# Patient Record
Sex: Female | Born: 2000
Health system: Southern US, Community
[De-identification: ages and names within clinical notes are randomized; demographics above are authoritative.]

## PROBLEM LIST (undated history)

## (undated) DIAGNOSIS — A749 Chlamydial infection, unspecified: Secondary | ICD-10-CM

## (undated) HISTORY — PX: TYMPANOSTOMY TUBE PLACEMENT: SHX32

## (undated) HISTORY — PX: TONSILLECTOMY: SUR1361

## (undated) HISTORY — DX: Chlamydial infection, unspecified: A74.9

---
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
## 2019-01-19 DIAGNOSIS — K5900 Constipation, unspecified: Secondary | ICD-10-CM | POA: Diagnosis not present

## 2019-02-10 DIAGNOSIS — Z01419 Encounter for gynecological examination (general) (routine) without abnormal findings: Secondary | ICD-10-CM | POA: Diagnosis not present

## 2019-02-10 DIAGNOSIS — A6004 Herpesviral vulvovaginitis: Secondary | ICD-10-CM | POA: Diagnosis not present

## 2019-02-10 DIAGNOSIS — Z113 Encounter for screening for infections with a predominantly sexual mode of transmission: Secondary | ICD-10-CM | POA: Diagnosis not present

## 2019-02-10 DIAGNOSIS — Z01411 Encounter for gynecological examination (general) (routine) with abnormal findings: Secondary | ICD-10-CM | POA: Diagnosis not present

## 2019-02-10 DIAGNOSIS — Z68.41 Body mass index (BMI) pediatric, 5th percentile to less than 85th percentile for age: Secondary | ICD-10-CM | POA: Diagnosis not present

## 2019-02-10 DIAGNOSIS — Z0001 Encounter for general adult medical examination with abnormal findings: Secondary | ICD-10-CM | POA: Diagnosis not present

## 2019-03-03 DIAGNOSIS — F1290 Cannabis use, unspecified, uncomplicated: Secondary | ICD-10-CM | POA: Diagnosis not present

## 2019-03-03 DIAGNOSIS — M542 Cervicalgia: Secondary | ICD-10-CM | POA: Diagnosis not present

## 2019-03-03 DIAGNOSIS — F17200 Nicotine dependence, unspecified, uncomplicated: Secondary | ICD-10-CM | POA: Diagnosis not present

## 2019-03-03 DIAGNOSIS — R55 Syncope and collapse: Secondary | ICD-10-CM | POA: Diagnosis not present

## 2019-03-03 DIAGNOSIS — S0990XA Unspecified injury of head, initial encounter: Secondary | ICD-10-CM | POA: Diagnosis not present

## 2019-03-03 DIAGNOSIS — M7918 Myalgia, other site: Secondary | ICD-10-CM | POA: Diagnosis not present

## 2019-03-03 DIAGNOSIS — J45909 Unspecified asthma, uncomplicated: Secondary | ICD-10-CM | POA: Diagnosis not present

## 2019-03-03 DIAGNOSIS — Z791 Long term (current) use of non-steroidal anti-inflammatories (NSAID): Secondary | ICD-10-CM | POA: Diagnosis not present

## 2019-03-03 DIAGNOSIS — Z79899 Other long term (current) drug therapy: Secondary | ICD-10-CM | POA: Diagnosis not present

## 2019-03-16 DIAGNOSIS — N760 Acute vaginitis: Secondary | ICD-10-CM | POA: Diagnosis not present

## 2019-03-29 DIAGNOSIS — N760 Acute vaginitis: Secondary | ICD-10-CM | POA: Diagnosis not present

## 2019-04-04 DIAGNOSIS — R1110 Vomiting, unspecified: Secondary | ICD-10-CM | POA: Diagnosis not present

## 2019-04-04 DIAGNOSIS — T673XXA Heat exhaustion, anhydrotic, initial encounter: Secondary | ICD-10-CM | POA: Diagnosis not present

## 2019-04-04 DIAGNOSIS — Z79899 Other long term (current) drug therapy: Secondary | ICD-10-CM | POA: Diagnosis not present

## 2019-04-04 DIAGNOSIS — F17210 Nicotine dependence, cigarettes, uncomplicated: Secondary | ICD-10-CM | POA: Diagnosis not present

## 2019-04-04 DIAGNOSIS — W92XXXA Exposure to excessive heat of man-made origin, initial encounter: Secondary | ICD-10-CM | POA: Diagnosis not present

## 2019-04-04 DIAGNOSIS — R112 Nausea with vomiting, unspecified: Secondary | ICD-10-CM | POA: Diagnosis not present

## 2019-04-04 DIAGNOSIS — J45909 Unspecified asthma, uncomplicated: Secondary | ICD-10-CM | POA: Diagnosis not present

## 2019-05-06 DIAGNOSIS — Z0000 Encounter for general adult medical examination without abnormal findings: Secondary | ICD-10-CM | POA: Diagnosis not present

## 2019-05-06 DIAGNOSIS — Z0001 Encounter for general adult medical examination with abnormal findings: Secondary | ICD-10-CM | POA: Diagnosis not present

## 2019-05-06 DIAGNOSIS — L83 Acanthosis nigricans: Secondary | ICD-10-CM | POA: Diagnosis not present

## 2019-05-06 DIAGNOSIS — R635 Abnormal weight gain: Secondary | ICD-10-CM | POA: Diagnosis not present

## 2019-05-06 DIAGNOSIS — Z91018 Allergy to other foods: Secondary | ICD-10-CM | POA: Diagnosis not present

## 2019-06-28 DIAGNOSIS — N760 Acute vaginitis: Secondary | ICD-10-CM | POA: Diagnosis not present

## 2019-06-28 DIAGNOSIS — Z975 Presence of (intrauterine) contraceptive device: Secondary | ICD-10-CM | POA: Diagnosis not present

## 2019-06-28 DIAGNOSIS — Z113 Encounter for screening for infections with a predominantly sexual mode of transmission: Secondary | ICD-10-CM | POA: Diagnosis not present

## 2019-06-28 DIAGNOSIS — Z118 Encounter for screening for other infectious and parasitic diseases: Secondary | ICD-10-CM | POA: Diagnosis not present

## 2019-09-30 ENCOUNTER — Other Ambulatory Visit: Payer: Self-pay

## 2019-09-30 ENCOUNTER — Other Ambulatory Visit (HOSPITAL_COMMUNITY)
Admission: RE | Admit: 2019-09-30 | Discharge: 2019-09-30 | Disposition: A | Discharge status: Home / Self Care | Payer: Federal, State, Local not specified - PPO | Source: Ambulatory Visit | Attending: Obstetrics and Gynecology | Admitting: Obstetrics and Gynecology

## 2019-09-30 ENCOUNTER — Encounter: Payer: Self-pay | Admitting: Obstetrics and Gynecology

## 2019-09-30 ENCOUNTER — Ambulatory Visit (INDEPENDENT_AMBULATORY_CARE_PROVIDER_SITE_OTHER): Payer: Federal, State, Local not specified - PPO | Admitting: Obstetrics and Gynecology

## 2019-09-30 VITALS — BP 115/62 | HR 63 | Wt 198.3 lb

## 2019-09-30 DIAGNOSIS — Z975 Presence of (intrauterine) contraceptive device: Secondary | ICD-10-CM

## 2019-09-30 DIAGNOSIS — Q5128 Other doubling of uterus, other specified: Secondary | ICD-10-CM | POA: Insufficient documentation

## 2019-09-30 DIAGNOSIS — Z113 Encounter for screening for infections with a predominantly sexual mode of transmission: Secondary | ICD-10-CM | POA: Diagnosis not present

## 2019-09-30 DIAGNOSIS — A749 Chlamydial infection, unspecified: Secondary | ICD-10-CM | POA: Insufficient documentation

## 2019-09-30 DIAGNOSIS — N939 Abnormal uterine and vaginal bleeding, unspecified: Secondary | ICD-10-CM

## 2019-09-30 LAB — POCT PREGNANCY, URINE: Preg Test, Ur: NEGATIVE

## 2019-09-30 MED ORDER — NORGESTIMATE-ETH ESTRADIOL 0.25-35 MG-MCG PO TABS: 1.0000 | ORAL_TABLET | Freq: Every day | ORAL | 11 refills | Status: AC

## 2019-09-30 MED ORDER — VALACYCLOVIR HCL 500 MG PO TABS: 500.0000 mg | ORAL_TABLET | Freq: Every day | ORAL | 2 refills | Status: AC

## 2019-09-30 NOTE — Progress Notes (Signed)
GYNECOLOGY  ENCOUNTER NOTE  History:     Cheryl Conrad is a 19 y.o. G0P0000 female here for for STI testing. She has noticed a milky white discharge for 2 days and then today the color of her discharge changed to brown. Nexplanon in place for 2 years now. She does not have periods with the nexplanon.   Obstetric History OB History  Gravida Para Term Preterm AB Living  0 0 0 0 0 0  SAB TAB Ectopic Multiple Live Births  0 0 0 0 0    Past Medical History:  Diagnosis Date  . Chlamydia     Past Surgical History:  Procedure Laterality Date  . TONSILLECTOMY    . TYMPANOSTOMY TUBE PLACEMENT      Current Outpatient Medications on File Prior to Visit  Medication Sig Dispense Refill  . albuterol (PROVENTIL HFA;VENTOLIN HFA) 108 (90 Base) MCG/ACT inhaler Inhale 1-2 puffs into the lungs every 6 (six) hours as needed for wheezing or shortness of breath.    . valACYclovir (VALTREX) 500 MG tablet Take by mouth.    . clotrimazole (GYNE-LOTRIMIN) 1 % vaginal cream Place 1 Applicatorful vaginally at bedtime. (Patient not taking: Reported on 09/30/2019) 45 g 0  . EPINEPHrine 0.3 mg/0.3 mL IJ SOAJ injection SMARTSIG:1 Injection IM As Needed    . fluconazole (DIFLUCAN) 150 MG tablet Take 1 tablet today. Take 2nd tablet in 72 hours. 2 tablet 0  . fluconazole (DIFLUCAN) 150 MG tablet Take 1 tablet (150 mg total) by mouth every 3 (three) days. Take one dose today and repeat in 72 hours and repeat again in 72 hours. 3 tablet 0  . ondansetron (ZOFRAN ODT) 4 MG disintegrating tablet Take 1 tablet (4 mg total) by mouth every 8 (eight) hours as needed for nausea or vomiting. 10 tablet 0   No current facility-administered medications on file prior to visit.    Allergies  Allergen Reactions  . Peanut-Containing Drug Products Anaphylaxis  . Shellfish Allergy Anaphylaxis    Social History:  reports that she has never smoked. She has never used smokeless tobacco. She reports current drug use. Drug:  Marijuana. She reports that she does not drink alcohol.  Family History  Problem Relation Age of Onset  . Seizures Mother     The following portions of the patient's history were reviewed and updated as appropriate: allergies, current medications, past family history, past medical history, past social history, past surgical history and problem list.  Review of Systems Pertinent items noted in HPI and remainder of comprehensive ROS otherwise negative.  Physical Exam:  BP 115/62   Pulse 63   Wt 198 lb 4.8 oz (89.9 kg)   BMI 34.04 kg/m  CONSTITUTIONAL: Well-developed, well-nourished female in no acute distress.  HENT:  Normocephalic, atraumatic, External right and left ear normal.  EYES: Conjunctivae and EOM are normal. NECK: Normal range of motion, supple, no masses.   RESPIRATORY: Clear to auscultation bilaterally. Effort and breath sounds normal, no problems with respiration noted. ABDOMEN: Soft, no distention noted.  No tenderness, rebound or guarding.  PELVIC: Normal appearing external genitalia and urethral meatus; Bimanual exam without CMT, exam with ? Septation felt. Proceeded with speculum exam: septation seen, ? Didelphys uterus with possible double cervices.  Assessment and Plan:   1. Abnormal vaginal bleeding  - Cervicovaginal ancillary only( [place])  2. Nexplanon in place   3. Septate uterus  -MRI of Pelvic ordered  - Renal US ordered - Consulted with Dr.  Ervin who was present in the room at the time of exam.   Harrington Jobe, Artist Pais, NP 10/06/2019 7:16 PM

## 2019-10-01 LAB — CERVICOVAGINAL ANCILLARY ONLY
Bacterial Vaginitis (gardnerella): NEGATIVE
Candida Glabrata: NEGATIVE
Candida Vaginitis: NEGATIVE
Chlamydia: POSITIVE — AB
Comment: NEGATIVE
Comment: NEGATIVE
Comment: NEGATIVE
Comment: NEGATIVE
Comment: NEGATIVE
Comment: NORMAL
Neisseria Gonorrhea: NEGATIVE
Trichomonas: NEGATIVE

## 2019-10-04 ENCOUNTER — Telehealth: Payer: Self-pay

## 2019-10-04 ENCOUNTER — Other Ambulatory Visit: Payer: Self-pay | Admitting: Obstetrics and Gynecology

## 2019-10-04 DIAGNOSIS — Q5128 Other doubling of uterus, other specified: Secondary | ICD-10-CM

## 2019-10-04 DIAGNOSIS — A749 Chlamydial infection, unspecified: Secondary | ICD-10-CM

## 2019-10-04 MED ORDER — AZITHROMYCIN 500 MG PO TABS: 1000.0000 mg | ORAL_TABLET | Freq: Once | ORAL | 0 refills | Status: AC

## 2019-10-04 NOTE — Progress Notes (Signed)
Chlamydia positive.  Patient called and notified of results. Partner needs treatment Rx: Azithromycin 1 gram X 1 dose.    Duane Lope, NP 10/04/2019 3:47 PM

## 2019-10-04 NOTE — Telephone Encounter (Addendum)
Pt request test results from 09/30/19.  LM that I am returning her call if she could please call the office for results.  Pt returned call.  Per chart review Christie Beckers., NP notified pt of results and tx.    Addison Naegeli, RN 10/04/19

## 2019-12-08 DIAGNOSIS — Z113 Encounter for screening for infections with a predominantly sexual mode of transmission: Secondary | ICD-10-CM | POA: Diagnosis not present

## 2019-12-08 DIAGNOSIS — N920 Excessive and frequent menstruation with regular cycle: Secondary | ICD-10-CM | POA: Diagnosis not present

## 2019-12-09 ENCOUNTER — Ambulatory Visit: Payer: Federal, State, Local not specified - PPO | Attending: Family

## 2019-12-09 ENCOUNTER — Ambulatory Visit: Payer: Federal, State, Local not specified - PPO | Admitting: Obstetrics and Gynecology

## 2019-12-09 DIAGNOSIS — Z23 Encounter for immunization: Secondary | ICD-10-CM

## 2019-12-09 NOTE — Progress Notes (Signed)
   Covid-19 Vaccination Clinic  Name:  Cheryl Conrad    MRN: [id number] DOB: 30-Jun-2001  12/09/2019  Cheryl Conrad was observed post Covid-19 immunization for 15 minutes without incident. She was provided with Vaccine Information Sheet and instruction to access the V-Safe system.   Cheryl Conrad was instructed to call 911 with any severe reactions post vaccine: Marland Kitchen Difficulty breathing  . Swelling of face and throat  . A fast heartbeat  . A bad rash all over body  . Dizziness and weakness   Immunizations Administered    Name Date Dose VIS Date Route   Moderna COVID-19 Vaccine 12/09/2019  1:32 PM 0.5 mL 08/24/2019 Intramuscular   Manufacturer: Moderna   Lot: [id number]   NDC: 83094-076-80

## 2019-12-13 DIAGNOSIS — Q5110 Doubling of uterus with doubling of cervix and vagina without obstruction: Secondary | ICD-10-CM | POA: Diagnosis not present

## 2019-12-13 DIAGNOSIS — N925 Other specified irregular menstruation: Secondary | ICD-10-CM | POA: Diagnosis not present

## 2020-01-11 ENCOUNTER — Ambulatory Visit: Payer: Federal, State, Local not specified - PPO | Attending: Family

## 2020-01-11 DIAGNOSIS — Z23 Encounter for immunization: Secondary | ICD-10-CM

## 2020-01-11 NOTE — Progress Notes (Signed)
   Covid-19 Vaccination Clinic  Name:  Cheryl Conrad    MRN: [id number] DOB: 2001-06-13  01/11/2020  [name] was observed post Covid-19 immunization for 15 minutes without incident. She was provided with Vaccine Information Sheet and instruction to access the V-Safe system.   [name] was instructed to call 911 with any severe reactions post vaccine: Marland Kitchen Difficulty breathing  . Swelling of face and throat  . A fast heartbeat  . A bad rash all over body  . Dizziness and weakness   Immunizations Administered    Name Date Dose VIS Date Route   Moderna COVID-19 Vaccine 01/11/2020 12:58 PM 0.5 mL 08/2019 Intramuscular   Manufacturer: Moderna   Lot: [id number]   NDC: 30940-768-08

## 2020-02-29 DIAGNOSIS — J45909 Unspecified asthma, uncomplicated: Secondary | ICD-10-CM | POA: Diagnosis not present

## 2020-02-29 DIAGNOSIS — Z113 Encounter for screening for infections with a predominantly sexual mode of transmission: Secondary | ICD-10-CM | POA: Diagnosis not present

## 2020-02-29 DIAGNOSIS — J309 Allergic rhinitis, unspecified: Secondary | ICD-10-CM | POA: Diagnosis not present

## 2020-02-29 DIAGNOSIS — Z91018 Allergy to other foods: Secondary | ICD-10-CM | POA: Diagnosis not present

## 2020-02-29 DIAGNOSIS — Z0001 Encounter for general adult medical examination with abnormal findings: Secondary | ICD-10-CM | POA: Diagnosis not present

## 2020-03-09 DIAGNOSIS — Z6832 Body mass index (BMI) 32.0-32.9, adult: Secondary | ICD-10-CM | POA: Diagnosis not present

## 2020-03-09 DIAGNOSIS — Z01419 Encounter for gynecological examination (general) (routine) without abnormal findings: Secondary | ICD-10-CM | POA: Diagnosis not present

## 2020-03-09 DIAGNOSIS — Z113 Encounter for screening for infections with a predominantly sexual mode of transmission: Secondary | ICD-10-CM | POA: Diagnosis not present

## 2020-04-05 DIAGNOSIS — A5602 Chlamydial vulvovaginitis: Secondary | ICD-10-CM | POA: Diagnosis not present

## 2020-04-05 DIAGNOSIS — Z113 Encounter for screening for infections with a predominantly sexual mode of transmission: Secondary | ICD-10-CM | POA: Diagnosis not present

## 2020-04-05 DIAGNOSIS — Z202 Contact with and (suspected) exposure to infections with a predominantly sexual mode of transmission: Secondary | ICD-10-CM | POA: Diagnosis not present

## 2020-04-10 DIAGNOSIS — Q5110 Doubling of uterus with doubling of cervix and vagina without obstruction: Secondary | ICD-10-CM | POA: Diagnosis not present

## 2020-05-29 IMAGING — DX DG ANKLE COMPLETE 3+V*L*
3 series · 3 of 3 positions shown · non-contrast
Comparison: None.

CLINICAL DATA: Fell and twisted ankle.

EXAM:
LEFT ANKLE COMPLETE - 3+ VIEW

[x ankle ap left]
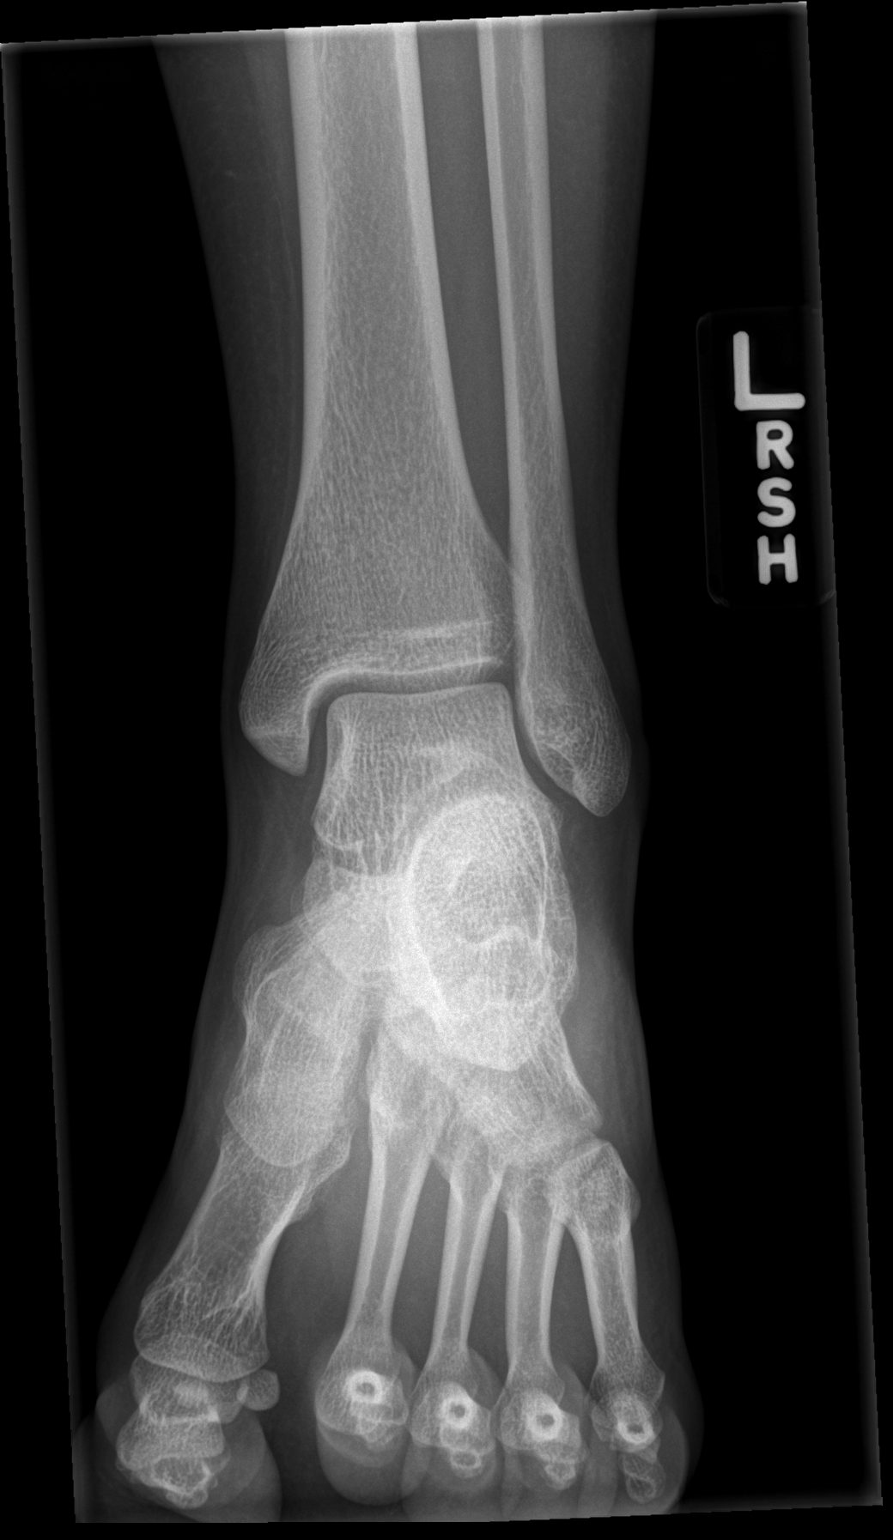

[x ankle obl left]
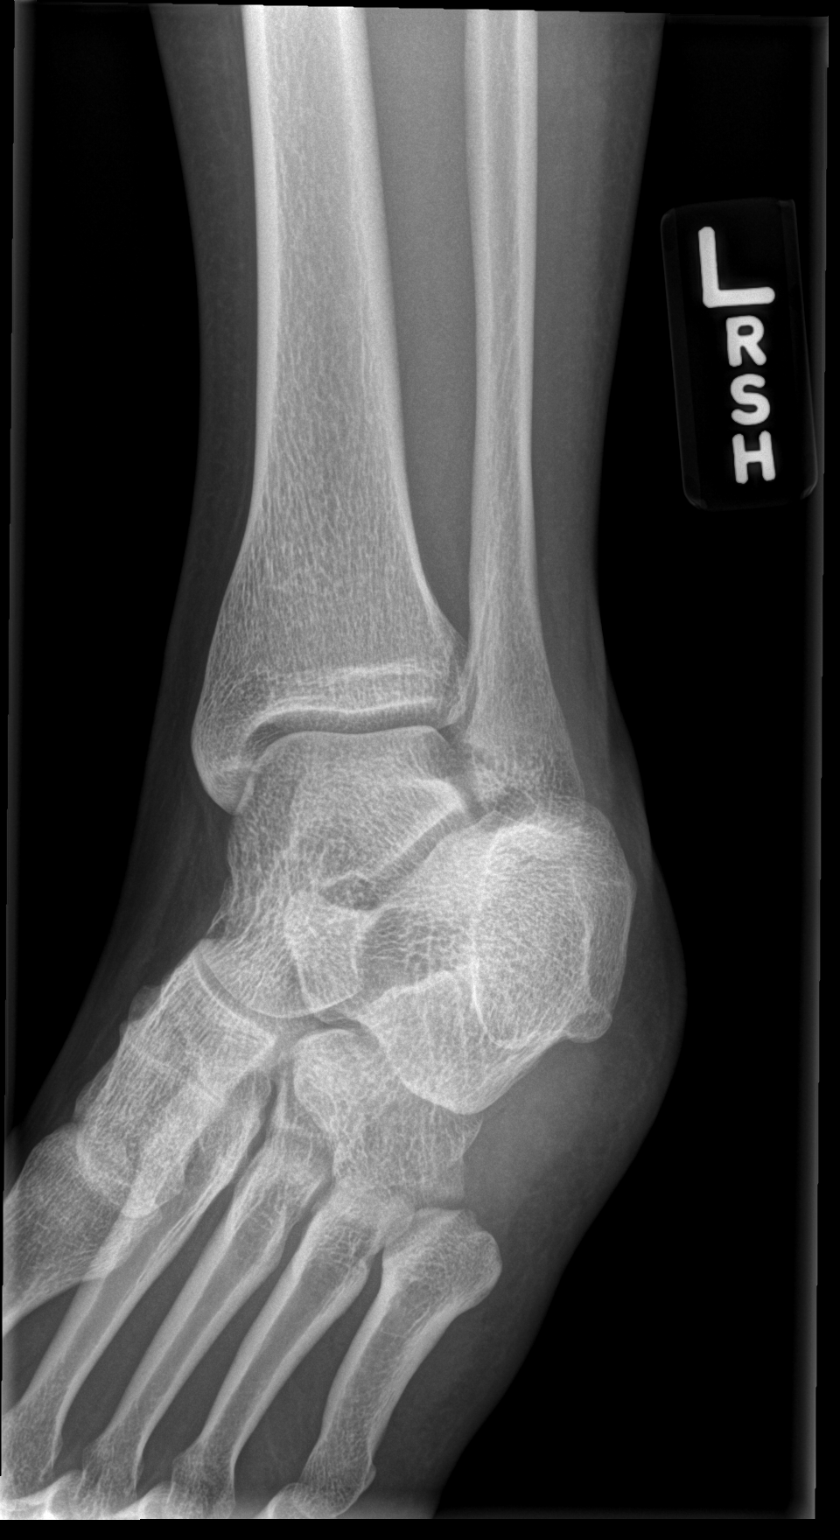

[x ankle lat left]
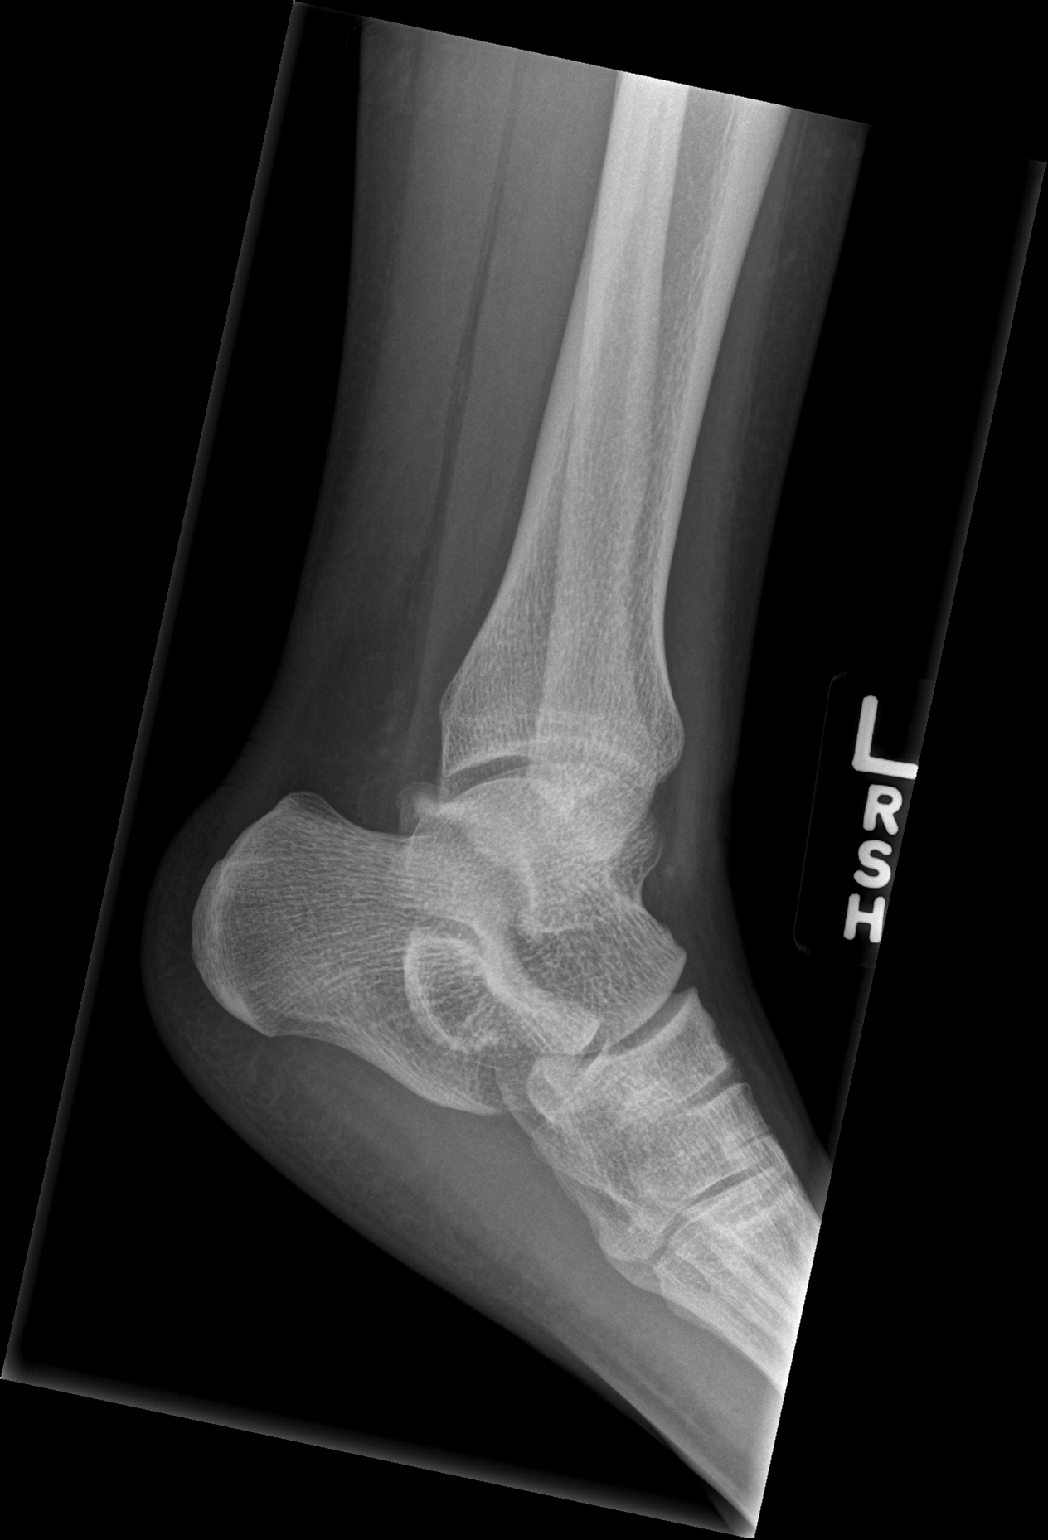

[3 of 3 positions shown; findings below may reference images not displayed]

FINDINGS: The ankle mortise is maintained. No acute ankle fracture or
osteochondral lesion. No definite ankle joint effusion. The mid and
hindfoot bony structures are intact.
IMPRESSION: No acute bony findings.

## 2022-05-16 ENCOUNTER — Ambulatory Visit (HOSPITAL_COMMUNITY): Payer: Self-pay | Admitting: Student in an Organized Health Care Education/Training Program

## 2022-05-16 ENCOUNTER — Encounter (HOSPITAL_COMMUNITY): Payer: Self-pay
